# Patient Record
Sex: Female | Born: 2000 | Race: White | Hispanic: No | Marital: Single | Smoking: Never smoker
Health system: Southern US, Community
[De-identification: ages and names within clinical notes are randomized; demographics above are authoritative.]

## PROBLEM LIST (undated history)

## (undated) DIAGNOSIS — F988 Other specified behavioral and emotional disorders with onset usually occurring in childhood and adolescence: Secondary | ICD-10-CM

## (undated) DIAGNOSIS — F419 Anxiety disorder, unspecified: Secondary | ICD-10-CM

## (undated) DIAGNOSIS — J45909 Unspecified asthma, uncomplicated: Secondary | ICD-10-CM

## (undated) HISTORY — PX: OVARIAN CYST REMOVAL: SHX89

---
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
